# Patient Record
Sex: Male | Born: 1970 | Race: Black or African American | Hispanic: No | Marital: Married
Health system: Southern US, Community
[De-identification: ages and names within clinical notes are randomized; demographics above are authoritative.]

---

## 2013-08-14 ENCOUNTER — Encounter (HOSPITAL_COMMUNITY): Payer: Self-pay | Admitting: Emergency Medicine

## 2013-08-14 ENCOUNTER — Emergency Department (INDEPENDENT_AMBULATORY_CARE_PROVIDER_SITE_OTHER): Payer: BC Managed Care – PPO

## 2013-08-14 ENCOUNTER — Emergency Department (HOSPITAL_COMMUNITY)
Admission: EM | Admit: 2013-08-14 | Discharge: 2013-08-14 | Disposition: A | Discharge status: Home / Self Care | Payer: BC Managed Care – PPO | Source: Home / Self Care | Attending: Family Medicine | Admitting: Family Medicine

## 2013-08-14 DIAGNOSIS — M79601 Pain in right arm: Secondary | ICD-10-CM

## 2013-08-14 DIAGNOSIS — M779 Enthesopathy, unspecified: Secondary | ICD-10-CM

## 2013-08-14 DIAGNOSIS — M79609 Pain in unspecified limb: Secondary | ICD-10-CM

## 2013-08-14 MED ORDER — PREDNISONE 10 MG PO KIT: PACK | ORAL | Status: AC

## 2013-08-14 MED ORDER — NAPROXEN 500 MG PO TABS: 500.0000 mg | ORAL_TABLET | Freq: Two times a day (BID) | ORAL | Status: AC

## 2013-08-14 MED ORDER — TRAMADOL HCL 50 MG PO TABS: 50.0000 mg | ORAL_TABLET | Freq: Four times a day (QID) | ORAL | Status: AC | PRN

## 2013-08-14 NOTE — Discharge Instructions (Signed)
Repetitive Strain Injuries  Repetitive strain injuries (RSIs) result from overuse or misuse of soft tissues including muscles, tendons, or nerves. Tendons are the cord-like structures that attach muscles to bones. RSIs can affect almost any part of the body. However, RSIs are most common in the arms (thumbs, wrists, elbows, shoulders) and legs (ankles, knees). Common medical conditions that are often caused by repetitive strain include carpal tunnel syndrome, tennis or golfer's elbow, bursitis, and tendonitis. If RSIs are treated early, and therepeated activity is reduced or removed, the severity and length of your problems can usually be reduced. RSIs are also called cumulative trauma disorders (CTD).   CAUSES   Many RSIs occur due to repeating the same activity at work over weeks or months without sufficient rest, such as prolonged typing. RSIs also commonly occur when a hobby or sport is done repeatedly without sufficient rest. RSIs can also occur due to repeated strain or stress on a body part in someone who has one or more risk factors for RSIs.  RISK FACTORS  Workplace risk factors   Frequent computer use, especially if your workstation is not adjusted for your body type.   Infrequent rest breaks.   Working in a high-pressure environment.   Working at a fast pace.   Repeating the same motion, such as frequent typing.   Working in an awkward position or holding the same position for a long time.   Forceful movements such as lifting, pulling, or pushing.   Vibration caused by using power tools.   Working in cold temperatures.   Job stress.  Personal risk factors   Poor posture.   Being loose-jointed.   Not exercising regularly.   Being overweight.   Arthritis, diabetes, thyroid problems, or other long-term (chronic)medical conditions.   Vitamin deficiencies.   Keeping your fingernails long.   An unhealthy, stressful, or inactive lifestyle.   Not sleeping well.  SYMPTOMS   Symptoms often  begin at work but become more noticeable after the repeated stress has ended. For example, you may develop fatigue or soreness in your wrist while typingat work, and at night you may develop numbness and tingling in your fingers. Common symptoms include:    Burning, shooting, or aching pain, especially in the fingers, palms, wrists, forearms, or shoulders.   Tenderness.   Swelling.   Tingling, numbness, or loss of feeling.   Pain with certain activities, such as turning a doorknob or reaching above your head.   Weakness, heaviness, or loss of coordination in yourhand.   Muscle spasms or tightness.  In some cases, symptoms can become so intense that it is difficult to perform everyday tasks. Symptoms that do not improve with rest may indicate a more serious condition.   DIAGNOSIS   Your caregiver may determine the type ofRSI you have based on your medical evaluation and a description of your activities.   TREATMENT   Treatment depends on the severity and type of RSI you have. Your caregiver may recommend rest for the affected body part, medicines, and physical or occupational therapy to reduce pain, swelling, and soreness. Discuss the activities you do repeatedly with your caregiver. Your caregiver can help you decide whether you need to change your activities. An RSI may take months or years to heal, especially if the affected body part gets insufficient rest. In some cases, such as severe carpal tunnel syndrome, surgery may be recommended.  PREVENTION   Talk with your supervisor to make sure you have the proper equipment   for your work station.   Maintain good posture at your desk or work station with:   Feet flat on the floor.   Knees directly over the feet, bent at a right angle.   Lower back supported by your chair or a cushion in the curve of your lower back.   Shoulders and arms relaxed and at your sides.   Neck relaxed and not bent forwards or backwards.   Your desk and computer workstation  properly adjusted to your body type.   Your chair adjusted so there is no excess pressure on the back of your thighs.   The keyboard resting above your thighs. You should be able to reach the keys with your elbows at your side, bent at a right angle. Your arms should be supported on forearm rests, with your forearms parallel to the ground.   The computer mouse within easy reach.   The monitor directly in front of you, so that your eyes are aligned with the top of the screen. The screen should be about 15 to 25 inches from your eyes.   While typing, keep your wrist straight, in a neutral position. Move your entire arm when you move your mouse or when typing hard-to-reach keys.   Only use your computer as much as you need to for work. Do not use it during breaks.   Take breaks often from any repeated activity. Alternate with another task which requires you to use different muscles, or rest at least once every hour.   Change positions regularly. If you spend a lot of time sitting, get up, walk around, and stretch.   Do not hold pens or pencils tightly when writing.   Exercise regularly.   Maintain a normal weight.   Eat a diet with plenty of vegetables, whole grains, and fruit.   Get sufficient, restful sleep.  HOME CARE INSTRUCTIONS   If your caregiver prescribed medicine to help reduce swelling, take it as directed.   Only take over-the-counter or prescription medicines for pain, discomfort, or fever as directed by your caregiver.   Reduce, and if needed, stopthe activities that are causing your problems until you have no further symptoms.If your symptoms are work-related, you may need to talk to your supervisor about changing your activities.   When symptoms develop, put ice or a cold pack on the aching area.   Put ice in a plastic bag.   Place a towel between your skin and the bag.   Leave the ice on for 15-20 minutes.   If you were given a splint to keep your wrist from bending, wear it as  instructed. It is important to wear the splint at night. Use the splint for as long as your caregiver recommends.  SEEK MEDICAL CARE IF:   You develop new problems.   Your problems do not get better with medicine.  MAKE SURE YOU:   Understand these instructions.   Will watch your condition.   Will get help right away if you are not doing well or get worse.  Document Released: 07/21/2002 Document Revised: 01/30/2012 Document Reviewed: 09/21/2011  [employer] Patient Information 2014 [employer]

## 2013-08-14 NOTE — ED Provider Notes (Signed)
CSN: [id number]     Arrival date & time 08/14/13  1534 History   First MD Initiated Contact with Patient 08/14/13 1750     Chief Complaint  Patient presents with  . Arm Pain   (Consider location/radiation/quality/duration/timing/severity/associated sxs/prior Treatment) HPI Comments: 43 year old male presents c/o constant arm pain for 2 months, worse with activity and internal rotation of the shoulder. Also much worse at night when he is sitting still. He does lots of repetitive overhead lifting at work and believes this may be contributing, but denies any specific injury.  This pain has gotten gradually worse.  No others symptoms and all other ROS negative.    Patient is a 43 y.o. male presenting with arm pain.  Arm Pain Pertinent negatives include no chest pain, no abdominal pain and no shortness of breath.    History reviewed. No pertinent past medical history. No past surgical history on file. No family history on file. History  Substance Use Topics  . Smoking status: Not on file  . Smokeless tobacco: Not on file  . Alcohol Use: Not on file    Review of Systems  Constitutional: Negative for fever, chills and fatigue.  HENT: Negative for sore throat.   Eyes: Negative for visual disturbance.  Respiratory: Negative for cough and shortness of breath.   Cardiovascular: Negative for chest pain, palpitations and leg swelling.  Gastrointestinal: Negative for nausea, vomiting, abdominal pain, diarrhea and constipation.  Genitourinary: Negative for dysuria, urgency, frequency and hematuria.  Musculoskeletal:       See HPI  Skin: Negative for rash.  Neurological: Negative for dizziness, weakness and light-headedness.    Allergies  Review of patient's allergies indicates no known allergies.  Home Medications   Current Outpatient Rx  Name  Route  Sig  Dispense  Refill  . naproxen (NAPROSYN) 500 MG tablet   Oral   Take 1 tablet (500 mg total) by mouth 2 (two) times daily.   60  tablet   0   . PredniSONE 10 MG KIT      12 day taper dose pack. Use as directed   48 each   0   . traMADol (ULTRAM) 50 MG tablet   Oral   Take 1 tablet (50 mg total) by mouth every 6 (six) hours as needed.   20 tablet   0    BP 138/90  Pulse 77  Temp(Src) 98.3 F (36.8 C) (Oral)  Resp 18  SpO2 99% Physical Exam  Nursing note and vitals reviewed. Constitutional: He is oriented to person, place, and time. He appears well-developed and well-nourished. No distress.  HENT:  Head: Normocephalic.  Pulmonary/Chest: Effort normal. No respiratory distress.  Musculoskeletal:       Right upper arm: He exhibits tenderness (tenderness around the distal insertion of the deltoid tendon into the humerus).  Neurological: He is alert and oriented to person, place, and time. Coordination normal.  Skin: Skin is warm and dry. No rash noted. He is not diaphoretic.  Psychiatric: He has a normal mood and affect. Judgment normal.    ED Course  Procedures (including critical care time) Labs Review Labs Reviewed - No data to display Imaging Review Dg Humerus Right  08/14/2013   CLINICAL DATA:  Right arm pain  EXAM: RIGHT HUMERUS - 2+ VIEW  COMPARISON:  None.  FINDINGS: The shoulder and elbow joints are maintained. No acute fracture of the humerus is identified.  IMPRESSION: No acute bony findings.   Electronically Signed   By:  Kalman Jewels M.D.   On: 08/14/2013 18:48      MDM   1. Tendinitis   2. Arm pain, right    XR negative.  Treat for tendinitis. Followup with ortho if this does not improve.    Meds ordered this encounter  Medications  . naproxen (NAPROSYN) 500 MG tablet    Sig: Take 1 tablet (500 mg total) by mouth 2 (two) times daily.    Dispense:  60 tablet    Refill:  0  . PredniSONE 10 MG KIT    Sig: 12 day taper dose pack. Use as directed    Dispense:  48 each    Refill:  0  . traMADol (ULTRAM) 50 MG tablet    Sig: Take 1 tablet (50 mg total) by mouth every 6 (six)  hours as needed.    Dispense:  20 tablet    Refill:  [place] Raylynne Cubbage, PA-C 08/15/13 [phone]

## 2013-08-14 NOTE — ED Notes (Signed)
C/o right arm pain Denies injury Does work at [employer] where he does repeated  motions

## 2013-08-15 NOTE — ED Provider Notes (Signed)
Medical screening examination/treatment/procedure(s) were performed by resident physician or non-physician practitioner and as supervising physician I was immediately available for consultation/collaboration.   Pennye Beeghly DOUGLAS MD.   [place] [address] D Teja Costen, MD 08/15/13 0944 

## 2014-12-22 IMAGING — CR DG HUMERUS 2V *R*
3 series · 3 of 3 positions shown · non-contrast
Comparison: None.

CLINICAL DATA: Right arm pain

EXAM:
RIGHT HUMERUS - 2+ VIEW

[view not recorded (1 of 3)]
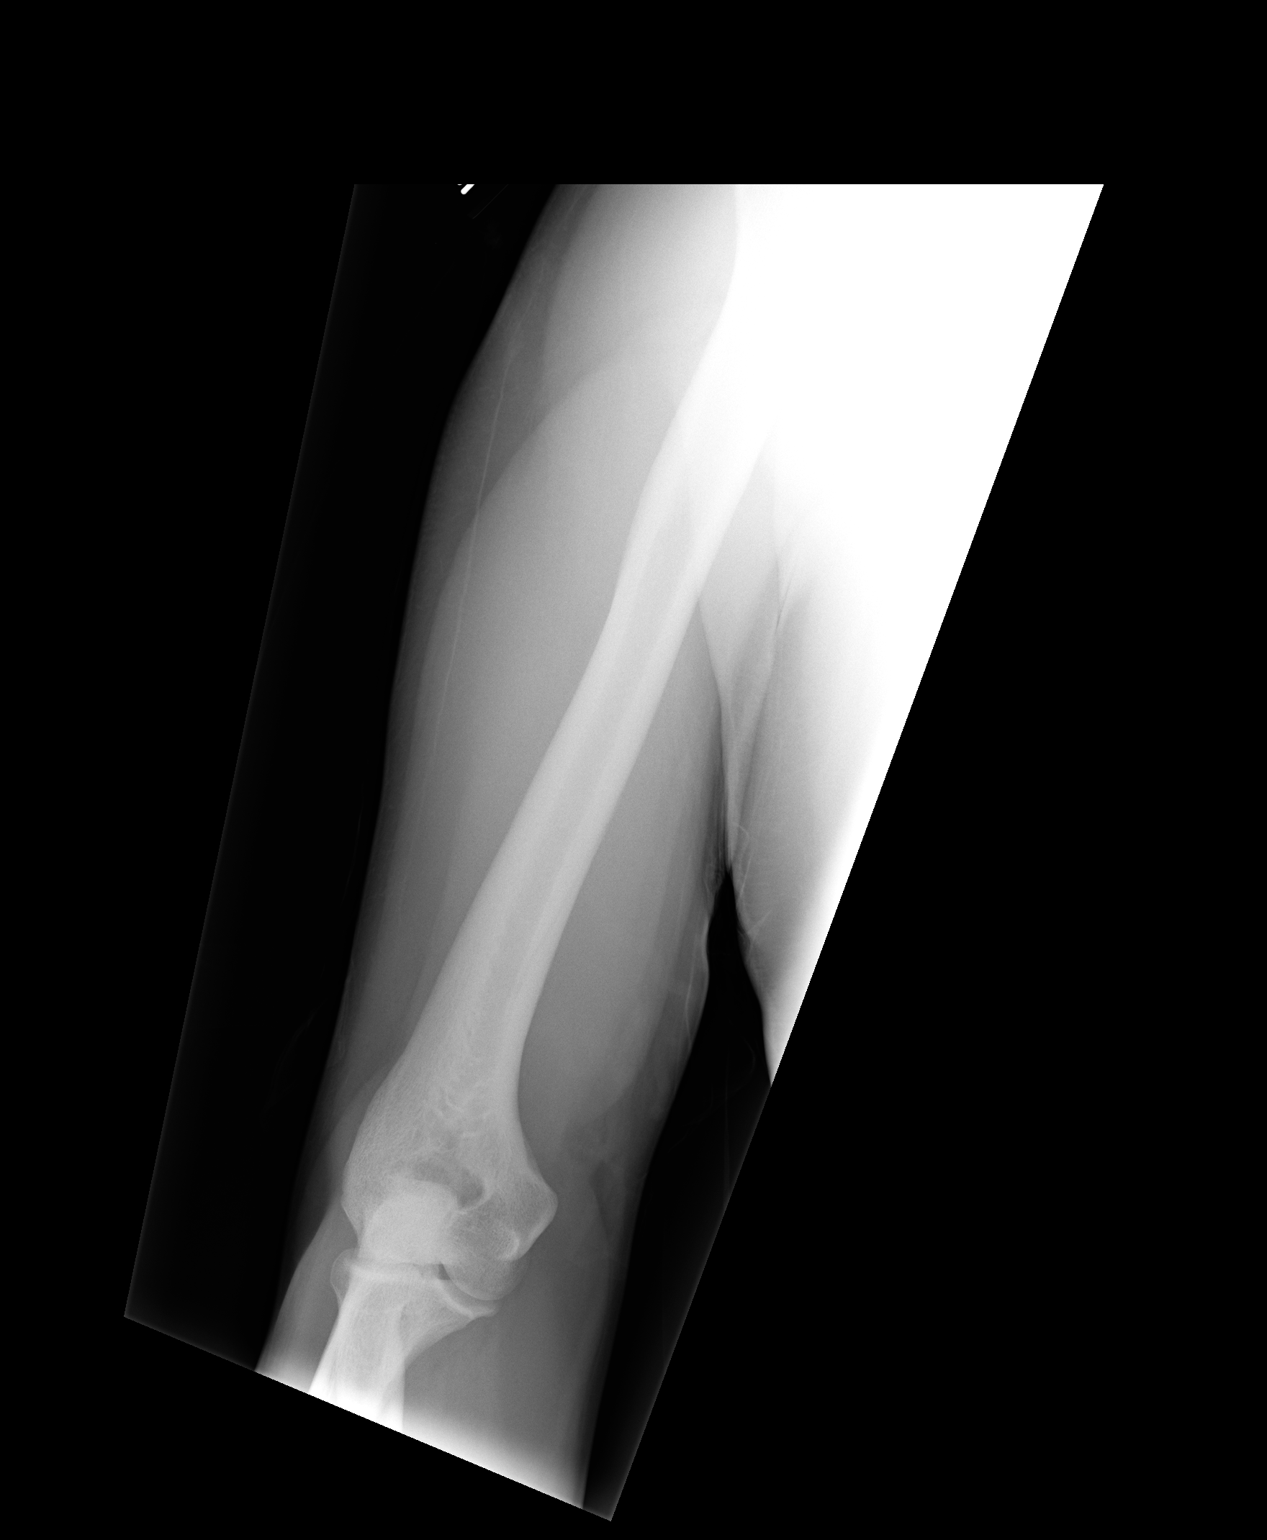

[view not recorded (2 of 3)]
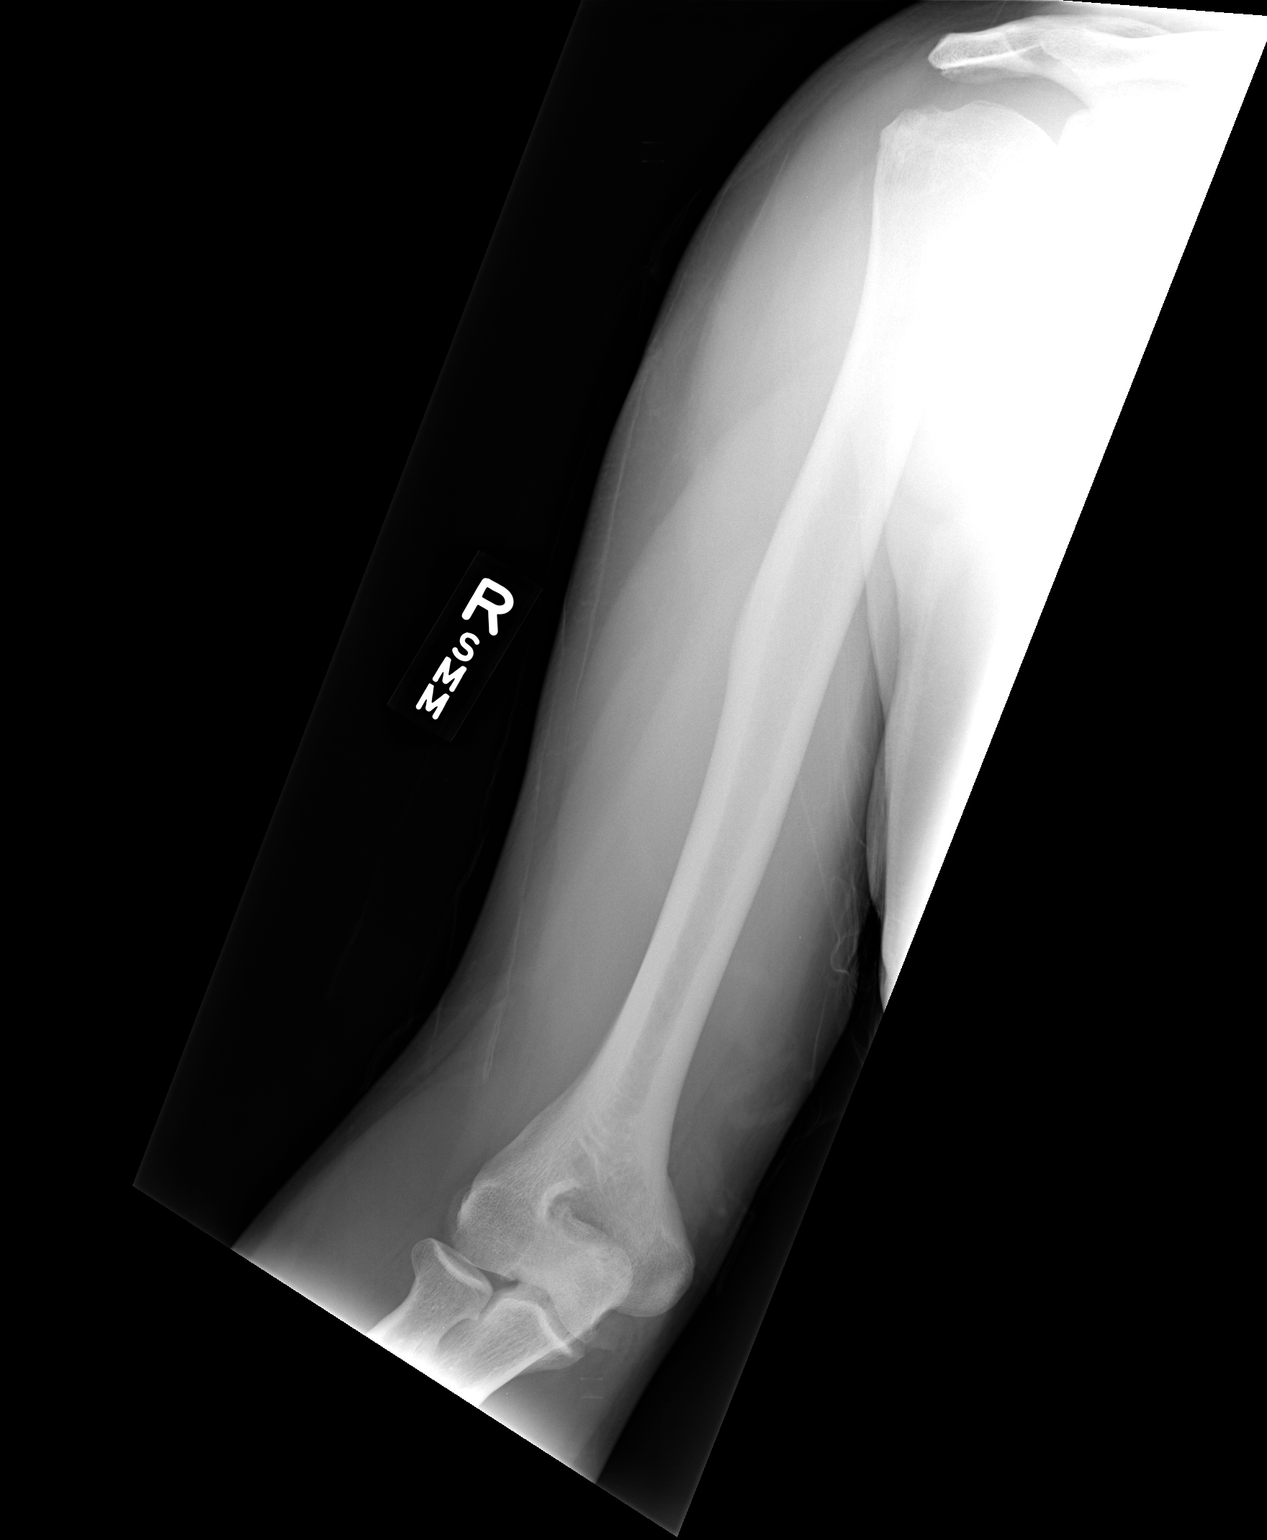

[view not recorded (3 of 3)]
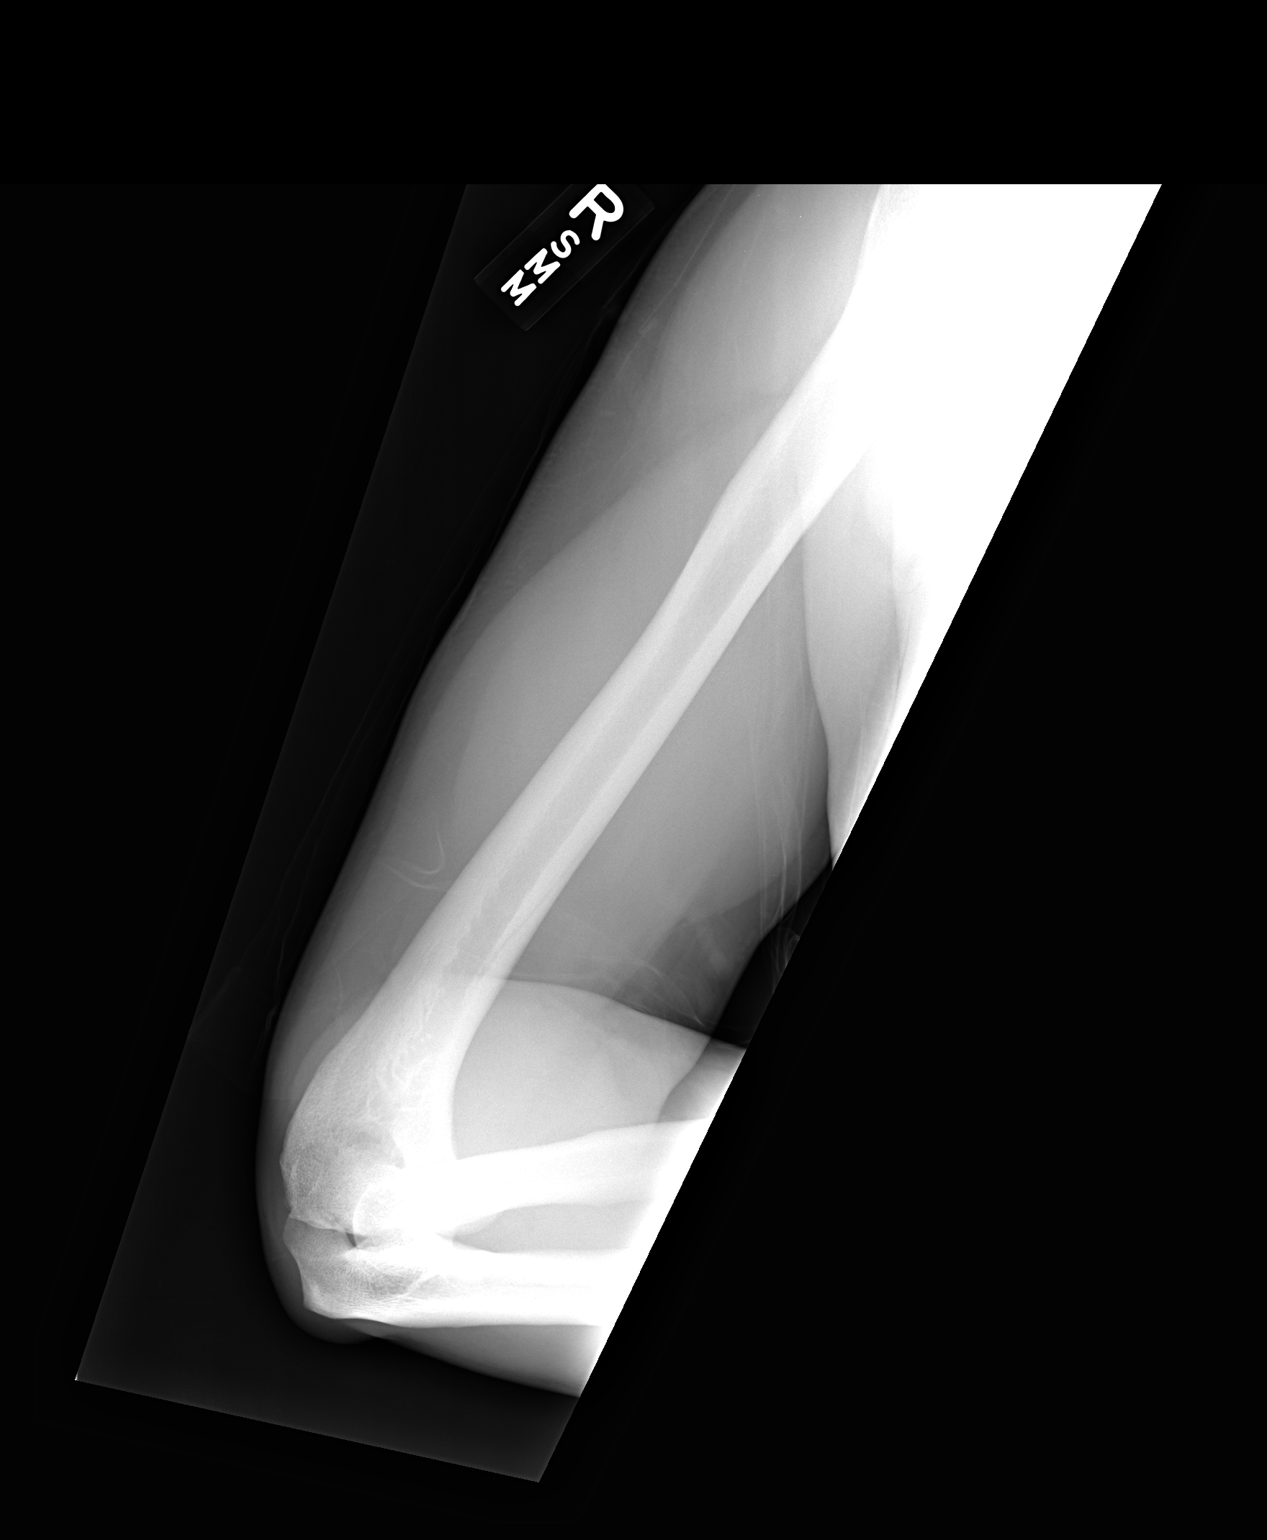

[3 of 3 positions shown; findings below may reference images not displayed]

FINDINGS: The shoulder and elbow joints are maintained. No acute fracture of
the humerus is identified.
IMPRESSION: No acute bony findings.
